# Patient Record
Sex: Male | Born: 1990 | Race: White | Hispanic: No | Marital: Single | Smoking: Never smoker
Health system: Southern US, Community
[De-identification: ages and names within clinical notes are randomized; demographics above are authoritative.]

---
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
## 2011-01-19 ENCOUNTER — Telehealth: Payer: Self-pay | Admitting: Cardiovascular Disease

## 2011-01-19 NOTE — Telephone Encounter (Signed)
Called wanting to schedule her sons to come in and have a cardiac check up with Dr. 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 The two children don't have and cardiac issues but the family does have a history of cardiac issues. It has been over four years since both of the children have come in to see Dr. Elease Hashimoto. Please call back.

## 2011-04-02 ENCOUNTER — Encounter: Payer: Self-pay | Admitting: Cardiovascular Disease

## 2011-04-02 ENCOUNTER — Ambulatory Visit (INDEPENDENT_AMBULATORY_CARE_PROVIDER_SITE_OTHER): Payer: BC Managed Care – PPO | Admitting: Cardiovascular Disease

## 2011-04-02 VITALS — BP 110/78 | HR 68 | Ht 78.0 in | Wt 200.8 lb

## 2011-04-02 DIAGNOSIS — R0602 Shortness of breath: Secondary | ICD-10-CM

## 2011-04-02 DIAGNOSIS — Z8249 Family history of ischemic heart disease and other diseases of the circulatory system: Secondary | ICD-10-CM

## 2011-04-02 NOTE — Assessment & Plan Note (Addendum)
He seems to have more shortness breath then he would anticipate. He's in a physical education class that involves a lot of running. He'll be running a Ten-K at the end of this class. He would like to return in December for an office visit. We'll then schedule  a stress echocardiogram to evaluate why he has some shortness of breath with exertion.    Has a family history of hyperlipidemia and also early premature death. We'll check a fasting lipid profile, hepatic profile, and basic metabolic profile when we see him again.

## 2011-04-02 NOTE — Progress Notes (Signed)
Kyle Livingston Date of Birth  [birth year] [facility] [address]    [address] [place], [place]  [address] [phone]  Fax  [phone]  History of Present Illness:  Kyle Livingston is a 20 year old gentleman who is in relatively good health. He has a family history of early cardiac death and also dyslipidemia.  Kyle Livingston has been relatively healthy and plays basketball used to play soccer. He notes that he gets out of breath quicker than he would anticipate. He does not have his much endurance as his classmates.  He is a running physical education class.  His final exam is to be able to complete a 10 K run.   He's concerned that he get short of breath even after exercising just as little as 5 or tenderness. He's never had any chest discomfort. He denies any syncope or presyncope.  We performed a stress echo on him in the past. I do not have those tracings for the record but to my recollection he walked for approximately 8-10 minutes on an advanced was protocol test. There were no significant abnormalities on the echocardiogram.    No current outpatient prescriptions on file prior to visit.    No Known Allergies  No past medical history on file.  No past surgical history on file.  History  Smoking status  . Never Smoker   Smokeless tobacco  . Not on file    History  Alcohol Use     No family history on file.  Reviw of Systems:  Reviewed in the HPI.  All other systems are negative.  Physical Exam: BP 110/78  Pulse 68  Ht 6\' 6"  (1.981 m)  Wt 200 lb 12.8 oz (91.082 kg)  BMI 23.20 kg/m2 The patient is alert and oriented x 3.  The mood and affect are normal.   Skin: warm and dry.  Color is normal.    HEENT:   the sclera are nonicteric.  The mucous membranes are moist.  The carotids are 2+ without bruits.  There is no thyromegaly.  There is no JVD.    Lungs: clear.  The chest wall is non tender.    Heart: regular rate with a normal S1 and S2.  There are no murmurs,  gallops, or rubs. The PMI is not displaced.     Abdomen: good bowel sounds.  There is no guarding or rebound.  There is no hepatosplenomegaly or tenderness.  There are no masses.   Extremities:  no clubbing, cyanosis, or edema.  The legs are without rashes.  The distal pulses are intact.   Neuro:  Cranial nerves II - XII are intact.  Motor and sensory functions are intact.    The gait is normal.  ECG: Normal sinus rhythm/sinus bradycardia. He has early repolarization changes that are consistent with his young age.  Assessment / Plan:

## 2011-04-02 NOTE — Patient Instructions (Signed)
Work on a good diet and exercise.  Return in December for office visit and labwork.

## 2011-04-22 ENCOUNTER — Telehealth: Payer: Self-pay | Admitting: Cardiovascular Disease

## 2011-04-22 NOTE — Telephone Encounter (Signed)
Dr Elease Hashimoto given note and he will call him personally.

## 2011-04-22 NOTE — Telephone Encounter (Signed)
Father is concerned that son cannot exercise/run like he thinks he should be able to.  He would like to speak to Dr. Elease Hashimoto regarding same.

## 2011-04-23 ENCOUNTER — Telehealth: Payer: Self-pay | Admitting: *Deleted

## 2011-04-23 DIAGNOSIS — R0602 Shortness of breath: Secondary | ICD-10-CM

## 2011-04-23 NOTE — Telephone Encounter (Signed)
Your physician has requested that you have a stress echocardiogram. For further information please visit [email or web address] Please follow instruction sheet as given. Request Sheri to do study per Dr Elease Hashimoto  Your physician has requested that you have an echocardiogram. Echocardiography is a painless test that uses sound waves to create images of your heart. It provides your doctor with information about the size and shape of your heart and how well your heart's chambers and valves are working. This procedure takes approximately one hour. There are no restrictions for this procedure.  Call Kevin/ pt father to schedule [phone]

## 2011-04-27 ENCOUNTER — Telehealth: Payer: Self-pay | Admitting: Cardiovascular Disease

## 2011-04-27 NOTE — Telephone Encounter (Signed)
Several calls were made to [place] parents since 04/23/11 as instructed by Jodette. I spoke with Earna Coder mom. She stated she will call me after she finds out her son school scheduled. I call Mr. Scallon today @ [phone], left a message on his voice mail.

## 2011-04-27 NOTE — Telephone Encounter (Signed)
I contacted father, his son just got back to [place] last night, Kevin/ father will call back today.

## 2011-05-05 ENCOUNTER — Encounter: Payer: Self-pay | Admitting: *Deleted

## 2011-05-14 ENCOUNTER — Other Ambulatory Visit (HOSPITAL_COMMUNITY): Payer: BC Managed Care – PPO

## 2011-05-14 ENCOUNTER — Other Ambulatory Visit (HOSPITAL_COMMUNITY): Payer: BC Managed Care – PPO | Admitting: Radiology

## 2011-06-16 ENCOUNTER — Other Ambulatory Visit: Payer: Self-pay | Admitting: Internal Medicine

## 2011-06-16 DIAGNOSIS — R0602 Shortness of breath: Secondary | ICD-10-CM

## 2011-06-21 ENCOUNTER — Ambulatory Visit: Payer: BC Managed Care – PPO | Admitting: Cardiovascular Disease

## 2011-06-21 ENCOUNTER — Telehealth: Payer: Self-pay | Admitting: Cardiovascular Disease

## 2011-06-21 NOTE — Telephone Encounter (Signed)
Called parents and answered questions.

## 2011-06-21 NOTE — Telephone Encounter (Signed)
New problem:  Pt father has question regarding testing on tomorrow.

## 2011-06-22 ENCOUNTER — Other Ambulatory Visit (HOSPITAL_COMMUNITY): Payer: BC Managed Care – PPO

## 2011-06-22 ENCOUNTER — Ambulatory Visit (HOSPITAL_COMMUNITY): Payer: BC Managed Care – PPO

## 2011-06-22 ENCOUNTER — Other Ambulatory Visit (HOSPITAL_COMMUNITY): Payer: BC Managed Care – PPO | Admitting: Radiology

## 2011-06-22 ENCOUNTER — Ambulatory Visit (HOSPITAL_COMMUNITY)
Admission: RE | Admit: 2011-06-22 | Discharge: 2011-06-22 | Disposition: A | Discharge status: Home / Self Care | Payer: BC Managed Care – PPO | Source: Ambulatory Visit | Attending: Cardiovascular Disease | Admitting: Cardiovascular Disease

## 2011-06-22 DIAGNOSIS — R0609 Other forms of dyspnea: Secondary | ICD-10-CM | POA: Insufficient documentation

## 2011-06-22 DIAGNOSIS — R0989 Other specified symptoms and signs involving the circulatory and respiratory systems: Secondary | ICD-10-CM | POA: Insufficient documentation

## 2011-06-22 DIAGNOSIS — R0602 Shortness of breath: Secondary | ICD-10-CM

## 2011-06-22 NOTE — Progress Notes (Signed)
  Echocardiogram Echocardiogram Stress Test has been performed.  Dorena Cookey 06/22/2011, 4:04 PM

## 2011-06-23 ENCOUNTER — Ambulatory Visit (INDEPENDENT_AMBULATORY_CARE_PROVIDER_SITE_OTHER): Payer: BC Managed Care – PPO | Admitting: Cardiovascular Disease

## 2011-06-23 ENCOUNTER — Encounter: Payer: Self-pay | Admitting: Cardiovascular Disease

## 2011-06-23 VITALS — BP 130/73 | HR 72 | Ht 79.0 in | Wt 202.0 lb

## 2011-06-23 DIAGNOSIS — R0602 Shortness of breath: Secondary | ICD-10-CM

## 2011-06-23 DIAGNOSIS — R0609 Other forms of dyspnea: Secondary | ICD-10-CM

## 2011-06-23 NOTE — Progress Notes (Signed)
    [name] Date of Birth  09-08-1990 [facility] [address]    [address] [place], [place]  [address] [phone]  Fax  [phone]  History of Present Illness:  Ian Malkin is a 20 year old gentleman who is the son of a friend of mine. He presents today for further evaluation of dyspnea with exertion.  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 He's had problems with dyspnea with exertion although his life. He recently took physical education class which required that he run a 10K. He was able to finish the 10 K but did fairly slowly despite training also Mr. long. His father commented that he really has had an endurance problem for all his life.  He denies any chest pain or syncope. He denies any muscular weakness. He denies any wheezing or exercise induced asthma.  No current outpatient prescriptions on file.    No Known Allergies  History reviewed. No pertinent past medical history.  Past Surgical History  Procedure Date  . Knee surgery     History  Smoking status  . Never Smoker   Smokeless tobacco  . Not on file    History  Alcohol Use     History reviewed. No pertinent family history.  Reviw of Systems:  Reviewed in the HPI.  All other systems are negative.  Physical Exam: BP 130/73  Pulse 72  Ht 6\' 7"  (2.007 m)  Wt 202 lb (91.627 kg)  BMI 22.76 kg/m2 The patient is alert and oriented x 3.  The mood and affect are normal.   Skin: warm and dry.  Color is normal.    HEENT:   Normocephalic/atraumatic. His carotids are normal. There is no JVD.  Lungs: Lung exam is clear. He has good breath sounds bilaterally.  His AP chest size is relatively small. He does not have a pectus excavatum.  His chest wall expansion appears to be normal.  Heart: Regular rate. He has a normal S1 and normal S2. He has no murmurs.    Abdomen: Good bowel sounds. There is no hepatosplenomegaly.  Extremities:  Have no clubbing cyanosis or edema.  Neuro:  Her exam is  nonfocal. There is good muscle strength. His gait is normal.    ECG: Normal sinus rhythm. Has no ST or T wave changes. He has early repolarization changes.  Assessment / Plan:

## 2011-06-23 NOTE — Assessment & Plan Note (Addendum)
Kyle Livingston had a cardiac stress test yesterday coupled with a stress echocardiogram. The cardiac portion of the test was completely normal. He appeared to have some limitations in his expiratory effort. His FEV1/FVC ratio was 84%.  His stress echo was completely normal. He had no inducible wall motion adenopathy. There were no significant valvular abnormalities.  I discussed the case with Dr. Gala Romney. He suggested that we proceed with a full set of primary function tests including diffusion capacity, lung volumes, MEP, MIP.  We'll go ahead and set [name] up for a pulmonologist appointment. I'll see him again on as-needed basis.

## 2011-06-23 NOTE — Patient Instructions (Addendum)
You have been referred to Dr Shan Levans pulmonary app to follow after PFT  Your physician has recommended that you have a pulmonary function test. Pulmonary Function Tests are a group of tests that measure how well air moves in and out of your lungs.  A chest x-ray takes a picture of the organs and structures inside the chest, including the heart, lungs, and blood vessels. This test can show several things, including, whether the heart is enlarges; whether fluid is building up in the lungs; and whether pacemaker / defibrillator leads are still in place.  Your physician recommends that you schedule a follow-up appointment in: as needed basis .

## 2011-07-26 ENCOUNTER — Ambulatory Visit (INDEPENDENT_AMBULATORY_CARE_PROVIDER_SITE_OTHER): Payer: BC Managed Care – PPO | Admitting: Pulmonary Disease

## 2011-07-26 ENCOUNTER — Ambulatory Visit (INDEPENDENT_AMBULATORY_CARE_PROVIDER_SITE_OTHER)
Admission: RE | Admit: 2011-07-26 | Discharge: 2011-07-26 | Disposition: A | Discharge status: Home / Self Care | Payer: BC Managed Care – PPO | Source: Ambulatory Visit | Attending: Cardiovascular Disease | Admitting: Cardiovascular Disease

## 2011-07-26 ENCOUNTER — Encounter: Payer: Self-pay | Admitting: Pulmonary Disease

## 2011-07-26 VITALS — BP 112/84 | HR 56 | Temp 97.6°F | Ht 79.0 in | Wt 201.0 lb

## 2011-07-26 DIAGNOSIS — R0609 Other forms of dyspnea: Secondary | ICD-10-CM

## 2011-07-26 DIAGNOSIS — R0989 Other specified symptoms and signs involving the circulatory and respiratory systems: Secondary | ICD-10-CM

## 2011-07-26 LAB — PULMONARY FUNCTION TEST

## 2011-07-26 MED ORDER — ALBUTEROL SULFATE HFA 108 (90 BASE) MCG/ACT IN AERS: INHALATION_SPRAY | RESPIRATORY_TRACT | Status: AC

## 2011-07-26 NOTE — Assessment & Plan Note (Signed)
The patient has dyspnea primarily with very heavy exertional activities, but has a normal pulmonary workup thus far except for a questionable ventilatory limitation on cardio pulmonary exercise testing.  There is no obvious history to support the diagnosis of asthma, however current testing does not totally exclude the diagnosis.  At this point, if he wishes to continue working through an evaluation, I would recommend proceeding with a methacholine challenge test.  This would put the issue of asthma to rest.  I am willing to give him an albuterol inhaler to try again pre-exercise in total we can sort through this.  The father would like to discuss this with the mother before proceeding with testing.  My only other thought is whether he may have an upper airway issue given his flow-volume loops, however, he is able to normalize the inspiratory limb on repeat testing today.  There is nothing to suggest a neuromuscular issue at this time.

## 2011-07-26 NOTE — Progress Notes (Signed)
PFT was done today.  

## 2011-07-26 NOTE — Progress Notes (Signed)
  Subjective:    Patient ID: Kyle Livingston, male    DOB: 1990/11/05, 21 y.o.   MRN: [id number]  HPI The patient is a 21 year old male who I have been asked to see for dyspnea on exertion.  The patient states that he has had problems with shortness of breath during heavier exertional activities for years, and the father has noted this as well.  He has no issues with day-to-day activities or even a moderate level of exertion.  He has noted with activities such as full court basketball or longer distance runs.  He has been labeled as having exercised induced asthma in the past, and states the inhaler helped "a little".  He has had a recent cardiopulmonary stress test which showed no circulatory limitation, but did suggest a ventilatory limitation.  He did not have a reduction in flows post exercise to suggest exercise-induced asthma.  The patient has had a chest x-ray today that is unremarkable, and his pulmonary function studies today showed normal flows, normal lung volumes, and a normal diffusion capacity.  His initial flow volume loop on the PFTs looked flattened, but I had this repeated and now appears normal.  The patient denies any cough or mucus production, and he does not have cough issues after stopping heavy exertional activities.  He does at times feel a fullness in his throat like mucus, but denies postnasal drip or gastroesophageal reflux disease.  He denies any spring or fall allergies of significance.   Review of Systems  Constitutional: Negative for fever and unexpected weight change.  HENT: Negative for ear pain, nosebleeds, congestion, sore throat, rhinorrhea, sneezing, trouble swallowing, dental problem, postnasal drip and sinus pressure.   Eyes: Negative for redness and itching.  Respiratory: Positive for shortness of breath. Negative for cough, chest tightness and wheezing.   Cardiovascular: Negative for palpitations and leg swelling.  Gastrointestinal: Negative for nausea and  vomiting.  Genitourinary: Negative for dysuria.  Musculoskeletal: Negative for joint swelling.  Skin: Negative for rash.  Neurological: Negative for headaches.  Hematological: Does not bruise/bleed easily.  Psychiatric/Behavioral: Negative for dysphoric mood. The patient is not nervous/anxious.        Objective:   Physical Exam Constitutional:  Well developed, no acute distress  HENT:  Nares patent without discharge  Oropharynx without exudate, palate and uvula are normal  Eyes:  Perrla, eomi, no scleral icterus  Neck:  No JVD, no TMG  Cardiovascular:  Normal rate, regular rhythm, no rubs or gallops.  No murmurs        Intact distal pulses  Pulmonary :  Normal breath sounds, no stridor or respiratory distress   No rales, rhonchi, or wheezing  Abdominal:  Soft, nondistended, bowel sounds present.  No tenderness noted.   Musculoskeletal:  No lower extremity edema noted.  Lymph Nodes:  No cervical lymphadenopathy noted  Skin:  No cyanosis noted  Neurologic:  Alert, appropriate, moves all 4 extremities without obvious deficit.         Assessment & Plan:

## 2011-07-26 NOTE — Patient Instructions (Addendum)
Would consider methacholine challenge testing to put issue of asthma to rest.  Please let me know. Can use albuterol prior to heavy exertional activities to see if will help, but would not continue doing this longterm without putting the issue of asthma to rest.

## 2011-07-29 ENCOUNTER — Telehealth: Payer: Self-pay

## 2011-07-29 NOTE — Telephone Encounter (Signed)
Per Jodette, Dr. Elease Hashimoto has PFT results and nothing further is needed.

## 2013-07-09 IMAGING — CR DG CHEST 2V
2 series · 2 of 2 positions shown · non-contrast
Comparison: None

CLINICAL DATA: Dyspnea

CHEST - 2 VIEW

[view not recorded (1 of 2)]
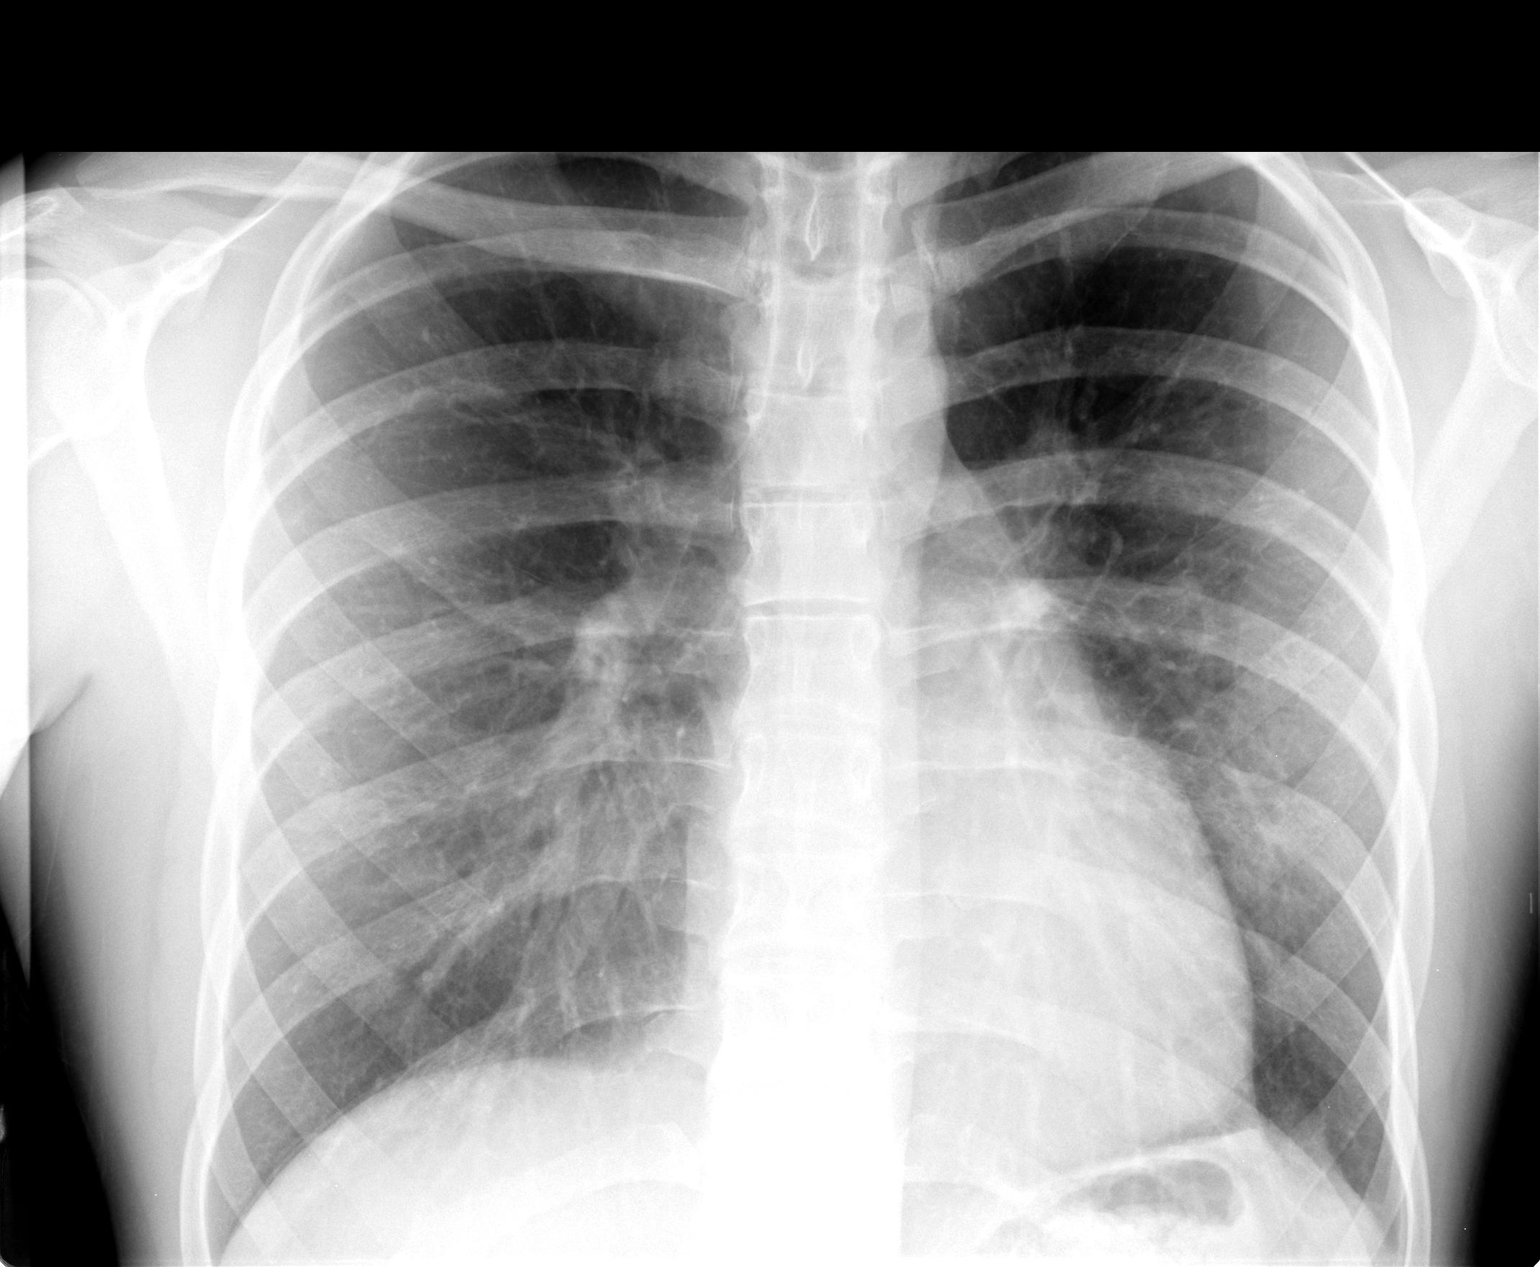

[view not recorded (2 of 2)]
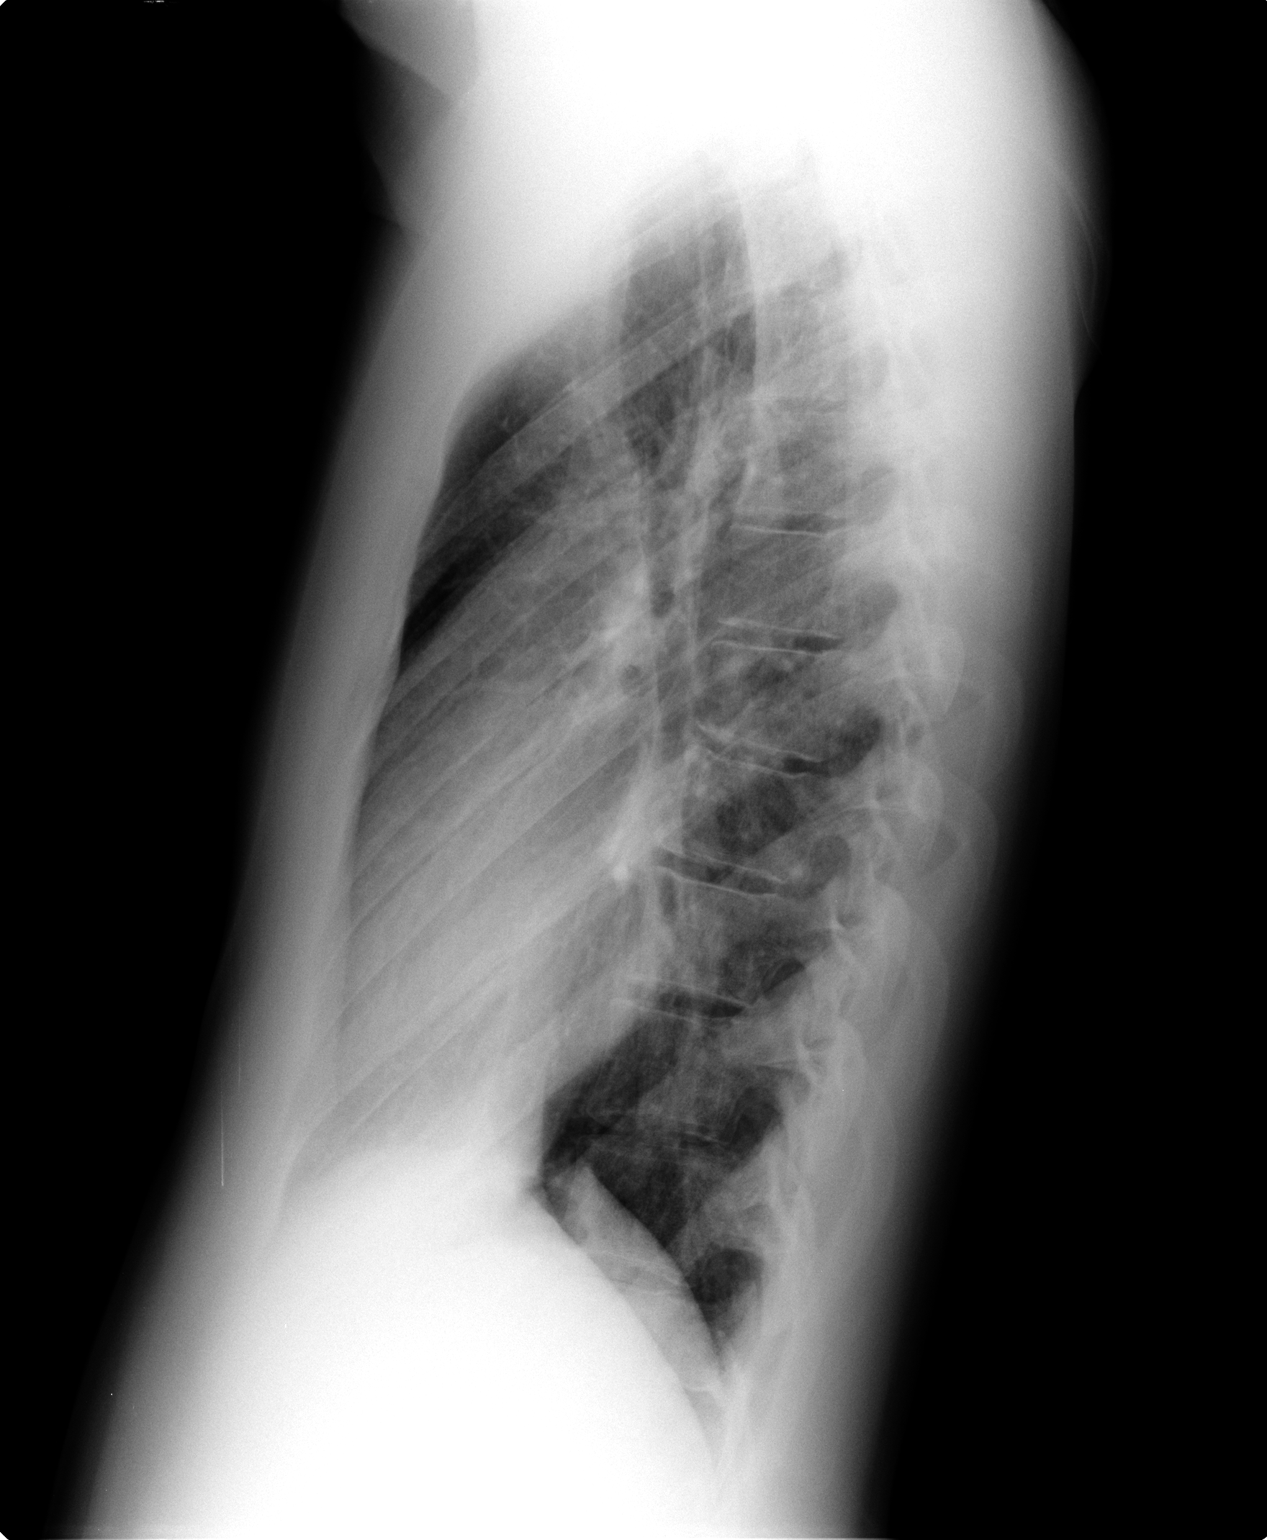

[2 of 2 positions shown; findings below may reference images not displayed]

FINDINGS: The heart size and mediastinal contours are within normal
limits.  Both lungs are clear.  The visualized skeletal structures
are unremarkable.
IMPRESSION: No active cardiopulmonary abnormalities.

## 2016-05-13 ENCOUNTER — Telehealth: Payer: Self-pay | Admitting: Cardiovascular Disease

## 2016-05-13 NOTE — Telephone Encounter (Signed)
Spoke with patient's father who is also our patient about an appointment for patient.  Dr. Elease HashimotoNahser advised that he is aware of the patient's symptoms which occurred on an airplane yesterday.  Patient had 2 episodes of syncope on the airplane and was evaluated in [place] in [place].  There was no definitive diagnosis.  I have scheduled him to see Dr. Elease HashimotoNahser on Monday 11/13.

## 2016-05-13 NOTE — Telephone Encounter (Signed)
New message    Father calling wants to speak with nurse regarding his son. No details was given

## 2016-05-17 ENCOUNTER — Encounter: Payer: Self-pay | Admitting: Cardiovascular Disease

## 2016-05-17 ENCOUNTER — Encounter (INDEPENDENT_AMBULATORY_CARE_PROVIDER_SITE_OTHER): Payer: Self-pay

## 2016-05-17 ENCOUNTER — Ambulatory Visit (INDEPENDENT_AMBULATORY_CARE_PROVIDER_SITE_OTHER): Payer: BLUE CROSS/BLUE SHIELD | Admitting: Cardiovascular Disease

## 2016-05-17 VITALS — BP 114/56 | HR 61 | Ht 79.0 in | Wt 180.0 lb

## 2016-05-17 DIAGNOSIS — I491 Atrial premature depolarization: Secondary | ICD-10-CM | POA: Diagnosis not present

## 2016-05-17 DIAGNOSIS — R4020 Unspecified coma: Secondary | ICD-10-CM | POA: Insufficient documentation

## 2016-05-17 DIAGNOSIS — R55 Syncope and collapse: Secondary | ICD-10-CM | POA: Diagnosis not present

## 2016-05-17 NOTE — Patient Instructions (Addendum)
Your physician has recommended that you wear an event monitor. Event monitors are medical devices that record the heart's electrical activity. Doctors most often us these monitors to diagnose arrhythmias. Arrhythmias are problems with the speed or rhythm of the heartbeat. The monitor is a small, portable device. You can wear one while you do your normal daily activities. This is usually used to diagnose what is causing palpitations/syncope (passing out).  Your physician recommends that you schedule a follow-up appointment as needed with Dr. Elease HashimotoNahser.

## 2016-05-17 NOTE — Progress Notes (Signed)
Cardiology Office Note   Date:  05/17/2016   ID:  Kyle DesanctisZachary R Livingston, DOB 03-16-1991, MRN [id number]  PCP:  Hoyle SauerAVVA,RAVISANKAR R, MD  Cardiologist:   Kristeen MissPhilip Sie Formisano, MD   Chief Complaint  Patient presents with  . Loss of Consciousness   Problem list 1. Syncope   History of Present Illness: Kyle Livingston is a 25 y.o. male who presents for further evaluation of an episode of syncope. [place]  Kyle Livingston lives in [place]. He was flying to [place] for business.   He generally sleeps on his flights. Ate a normal breakfast Was not able to sleep on this flight Got hot,  Sweats, nausea ,  Lost his vision ( grayed out )  Lots of sweats.  Got some OJ.   Stewardess said his speech was slurred.    5 minutes later , vision returned ,  Started feeling normal .  EMTs were at the airport when he landed.    Checked his glucose.   HR was 30 .   went to the hospital .   Had ECG ,  Echo reportedly was normal   Very active Runs regularly  - 4 times a week  [facility] [employer] exercises   No ETOH the night before.   No drugs,   This has never happened previously .  No near syncope    Past Medical History:  Diagnosis Date  . Asthma     Past Surgical History:  Procedure Laterality Date  . KNEE SURGERY  06/2008     Current Outpatient Prescriptions  Medication Sig Dispense Refill  . albuterol (PROAIR HFA) 108 (90 BASE) MCG/ACT inhaler Can use albuterol 30min prior to heavy exertional activities 1 Inhaler 2   No current facility-administered medications for this visit.     Allergies:   Patient has no known allergies.    Social History:  The patient  reports that he has never smoked. He has never used smokeless tobacco. He reports that he drinks alcohol. He reports that he does not use drugs.   Family History:  The patient's family history includes Heart attack in his maternal grandfather, maternal uncle, and paternal uncle.    ROS:   Please see the history of present illness.    Review of Systems: Constitutional:  denies fever, chills, diaphoresis, appetite change and fatigue.  HEENT: denies photophobia, eye pain, redness, hearing loss, ear pain, congestion, sore throat, rhinorrhea, sneezing, neck pain, neck stiffness and tinnitus.  Respiratory: denies SOB, DOE, cough, chest tightness, and wheezing.  Cardiovascular: denies chest pain, palpitations and leg swelling.  Gastrointestinal: denies nausea, vomiting, abdominal pain, diarrhea, constipation, blood in stool.  Genitourinary: denies dysuria, urgency, frequency, hematuria, flank pain and difficulty urinating.  Musculoskeletal: denies  myalgias, back pain, joint swelling, arthralgias and gait problem.   Skin: denies pallor, rash and wound.  Neurological: denies dizziness, seizures, syncope, weakness, light-headedness, numbness and headaches.   Hematological: denies adenopathy, easy bruising, personal or family bleeding history.  Psychiatric/ Behavioral: denies suicidal ideation, mood changes, confusion, nervousness, sleep disturbance and agitation.       All other systems are reviewed and negative.    PHYSICAL EXAM: VS:  BP (!) 114/56   Pulse 61   Ht 6\' 7"  (2.007 m)   Wt 180 lb (81.6 kg)   SpO2 99%   BMI 20.28 kg/m  , BMI Body mass index is 20.28 kg/m. GEN: Well nourished, well developed, in no acute  distress  HEENT: normal  Neck: no JVD, carotid bruits, or masses Cardiac: RRR; no murmurs, rubs, or gallops,no edema  Respiratory:  clear to auscultation bilaterally, normal work of breathing GI: soft, nontender, nondistended, + BS MS: no deformity or atrophy  Skin: warm and dry, no rash Neuro:  Strength and sensation are intact Psych: normal   EKG:  EKG is ordered today. The ekg ordered today demonstrates  Sinus brady ag 51.   EARly repol.    A previous ECG showed ectopic atrial bradycardia    Recent Labs: No results found for requested labs within  last 8760 hours.    Lipid Panel No results found for: CHOL, TRIG, HDL, CHOLHDL, VLDL, LDLCALC, LDLDIRECT    Wt Readings from Last 3 Encounters:  05/17/16 180 lb (81.6 kg)  07/26/11 201 lb (91.2 kg)  06/23/11 202 lb (91.6 kg)      Other studies Reviewed: Additional studies/ records that were reviewed today include: . Review of the above records demonstrates:    ASSESSMENT AND PLAN:  1.  Syncope: Kyle Livingston presents today for an episode of syncope/near sig and BE that occurred while he was flying on an airplane [place] to [place]. He had nausea and profuse diaphoresis. He had graying out of his vision. EMTs were on-site when he landed. His glucose normal was normal. EKG showed ectopic rhythm but was otherwise normal.  He was evaluated in the emergency room. An echocardiogram was reportedly normal although I do not have that echo or the report. His labs were normal.   He has a resting bradycardia and has had ectopic atrial rhythm on 2 EKGs. I suspect that his heart rate was quite slow resulting in severe hypotension.  We discussed making sure that he eats and drinks normally. We'll place a 30 day monitor for further evaluation. We will follow-up with him after that monitor has been evaluated. He will call me if he has any recurrent episodes of syncope or presyncope. We'll get a release of information to have the echo sent to us.    Current medicines are reviewed at length with the patient today.  The patient does not have concerns regarding medicines.  Labs/ tests ordered today include:  No orders of the defined types were placed in this encounter.    Disposition:   FU with  PRN      Kristeen MissPhilip Kamala Kolton, MD  05/17/2016 7:55 AM    [facility] [address], [place], [place]  [address] Phone: [phone]) [phone]; Fax: [phone]

## 2016-05-24 ENCOUNTER — Telehealth: Payer: Self-pay | Admitting: Cardiovascular Disease

## 2016-05-24 NOTE — Telephone Encounter (Signed)
Left detailed message on patient's voice mail regarding his question about seeing a neurologist.  I advised that per Dr. Elease HashimotoNahser, symptoms seem to be related to heart rate and BP and to continue to plan to get cardiac monitor on Wed.  I advised him to call back to discuss if there are new or concerning symptoms he would like to discuss.

## 2016-05-24 NOTE — Telephone Encounter (Signed)
Mr. Annamarie Majoroomey is calling to see if Kyle Livingston would need to be recommended to a Neurologist . Please call .Marland Kitchen. Thanks

## 2016-05-24 NOTE — Telephone Encounter (Signed)
[name]'s symptoms sound more like slow HR and low BP. We have ordered a cardiac monitor.  I would defer to his primary MD as to whether or not he needs to see a neurologist.

## 2016-05-26 ENCOUNTER — Ambulatory Visit (INDEPENDENT_AMBULATORY_CARE_PROVIDER_SITE_OTHER): Payer: BLUE CROSS/BLUE SHIELD

## 2016-05-26 DIAGNOSIS — R55 Syncope and collapse: Secondary | ICD-10-CM

## 2016-06-08 ENCOUNTER — Telehealth: Payer: Self-pay | Admitting: *Deleted

## 2016-06-08 NOTE — Telephone Encounter (Signed)
Spoke with patient earlier today.  He expressed concern [employer]  Patch monitor not working properly and wanted to discontinue using the monitor.   I spoke with [employer] and let them know about the patients disappointed with their monitor.  By patients explanation, I believe the problem is with the chip.  I asked [employer] to reach out to the patient and ultimately send him an entire new device.  Left message on patients answering machine.

## 2016-06-30 ENCOUNTER — Telehealth: Payer: Self-pay | Admitting: Nurse Practitioner

## 2016-06-30 NOTE — Telephone Encounter (Signed)
Spoke with patient to review monitor results.  He states he notified our office and was told a new monitor would be mailed to him.  He states he never received it and only wore this monitor for approximately 11 days. I advised that I will follow-up with Dr. Elease HashimotoNahser to see if additional monitoring is needed. Per Dr. Elease HashimotoNahser, there were no abnormalities during the time he wore the monitor so if patient has remained symptom free, he does not need to continue monitoring.  I spoke with patient who states he has not had any symptoms of syncope since initial event in early November. I reviewed Dr. Harvie BridgeNahser's advice that no further monitoring is needed. He verbalized understanding and agrees to call back if symptoms return.  He thanked me for the call.

## 2018-04-11 ENCOUNTER — Telehealth: Payer: Self-pay | Admitting: Cardiovascular Disease

## 2018-04-11 NOTE — Telephone Encounter (Signed)
It will be fine for Kyle Livingston to train for and run in a marathon. Please make sure he is hydrating well while running

## 2018-04-11 NOTE — Telephone Encounter (Signed)
New Message    Father is calling on behalf of son. He wants to know if its okay for him to participate in a marathon. Please call to advise.

## 2018-04-11 NOTE — Telephone Encounter (Signed)
Left detailed message on patient's voice mail with Dr. Harvie Bridge advice and advised him to call back with questions or concerns

## 2019-10-18 ENCOUNTER — Telehealth: Payer: Self-pay | Admitting: Cardiovascular Disease

## 2019-10-18 NOTE — Telephone Encounter (Signed)
    I received a message that Kyle Livingston was having shortness of breath with exertion.  Kyle Livingston is an extremely healthy young gentleman.  He is an avid runner.  He ran a half marathon last year and the year before.  He continues to run about 15 miles per week split into 3 different runs.  He is noticed some shortness of breath when he gets to mile 4 - 5.  He denies any pleuritic chest pain.  He denies any fever or hemoptysis.  He denies any chest pain.  He received a change a Covid vaccine several weeks ago.  It is also been a bad pollen season down in [place], [place] which is where he lives.  He is fine at rest.  He denies any PND orthopnea.  He he denies any fever.  He denies any bleeding.  He takes Claritin on a daily basis. I offered to call in an albuterol inhaler.  He has an appointment to see his doctor in several weeks.  He will discuss the possibility of asthma with his doctor.  He had some childhood asthma while growing up.  I have asked him to call me if he has any worsening problems.  At this point I do not think that there is an acute serious pulmonary issue.  I have given him the okay to continue running.  He has noticed that if he runs short a faster pace he does not have shortness of breath so he may shorten his run slightly for the time being.    Kristeen Miss, MD  10/18/2019 12:46 PM    [facility] [address] [place],  [address] [place], [place]  [address] Phone: [phone]; Fax: [phone]
